# Patient Record
Sex: Male | Born: 1985 | Race: White | Hispanic: No | Marital: Single | Smoking: Former smoker
Health system: Southern US, Community
[De-identification: ages and names within clinical notes are randomized; demographics above are authoritative.]

## PROBLEM LIST (undated history)

## (undated) DIAGNOSIS — E7800 Pure hypercholesterolemia, unspecified: Secondary | ICD-10-CM

---

## 2015-07-28 ENCOUNTER — Emergency Department (HOSPITAL_COMMUNITY): Payer: 59

## 2015-07-28 ENCOUNTER — Encounter (HOSPITAL_COMMUNITY): Payer: Self-pay | Admitting: Emergency Medicine

## 2015-07-28 ENCOUNTER — Emergency Department (HOSPITAL_COMMUNITY)
Admission: EM | Admit: 2015-07-28 | Discharge: 2015-07-28 | Disposition: A | Discharge status: Home / Self Care | Payer: 59 | Attending: Emergency Medicine | Admitting: Emergency Medicine

## 2015-07-28 DIAGNOSIS — Z79899 Other long term (current) drug therapy: Secondary | ICD-10-CM | POA: Diagnosis not present

## 2015-07-28 DIAGNOSIS — F419 Anxiety disorder, unspecified: Secondary | ICD-10-CM | POA: Diagnosis not present

## 2015-07-28 DIAGNOSIS — R0602 Shortness of breath: Secondary | ICD-10-CM | POA: Insufficient documentation

## 2015-07-28 DIAGNOSIS — Z87891 Personal history of nicotine dependence: Secondary | ICD-10-CM | POA: Insufficient documentation

## 2015-07-28 DIAGNOSIS — E7800 Pure hypercholesterolemia, unspecified: Secondary | ICD-10-CM | POA: Diagnosis not present

## 2015-07-28 DIAGNOSIS — Z880 Allergy status to penicillin: Secondary | ICD-10-CM | POA: Diagnosis not present

## 2015-07-28 DIAGNOSIS — R51 Headache: Secondary | ICD-10-CM | POA: Insufficient documentation

## 2015-07-28 DIAGNOSIS — R079 Chest pain, unspecified: Secondary | ICD-10-CM | POA: Diagnosis not present

## 2015-07-28 HISTORY — DX: Pure hypercholesterolemia, unspecified: E78.00

## 2015-07-28 LAB — CBC
HEMATOCRIT: 43.1 % (ref 39.0–52.0)
Hemoglobin: 14.8 g/dL (ref 13.0–17.0)
MCH: 30.1 pg (ref 26.0–34.0)
MCHC: 34.3 g/dL (ref 30.0–36.0)
MCV: 87.8 fL (ref 78.0–100.0)
Platelets: 246 10*3/uL (ref 150–400)
RBC: 4.91 MIL/uL (ref 4.22–5.81)
RDW: 13.1 % (ref 11.5–15.5)
WBC: 10.3 10*3/uL (ref 4.0–10.5)

## 2015-07-28 LAB — BASIC METABOLIC PANEL
Anion gap: 8 (ref 5–15)
BUN: 12 mg/dL (ref 6–20)
CALCIUM: 9.2 mg/dL (ref 8.9–10.3)
CO2: 26 mmol/L (ref 22–32)
Chloride: 104 mmol/L (ref 101–111)
Creatinine, Ser: 0.96 mg/dL (ref 0.61–1.24)
GFR calc Af Amer: >60 mL/min (ref >60)
GLUCOSE: 82 mg/dL (ref 65–99)
Potassium: 3.8 mmol/L (ref 3.5–5.1)
Sodium: 138 mmol/L (ref 135–145)

## 2015-07-28 LAB — TROPONIN I

## 2015-07-28 MED ORDER — ASPIRIN 81 MG PO CHEW
324.0000 mg | CHEWABLE_TABLET | Freq: Once | ORAL | Status: AC
  Administered 2015-07-28: 324 mg via ORAL
  Filled 2015-07-28: qty 4

## 2015-07-28 MED ORDER — GI COCKTAIL ~~LOC~~
30.0000 mL | Freq: Once | ORAL | Status: AC
  Administered 2015-07-28: 30 mL via ORAL
  Filled 2015-07-28: qty 30

## 2015-07-28 NOTE — ED Notes (Signed)
EKG and chest x-ray already done

## 2015-07-28 NOTE — Discharge Instructions (Signed)

## 2015-07-28 NOTE — ED Notes (Signed)
Pt reports cp in mid chest, inc pain when breathing deeply.  Pain radiates into neck bilaterally.   Pt denies sob.  Pt alert and oriented.

## 2015-07-28 NOTE — ED Provider Notes (Signed)
CSN: [id number]     Arrival date & time 07/28/15  1334 History   First MD Initiated Contact with Patient 07/28/15 1432     Chief Complaint  Patient presents with  . Chest Pain     Patient is a 30 y.o. male presenting with chest pain. The history is provided by the patient.  Chest Pain Pain location:  Substernal area Pain quality: tightness   Pain radiates to:  Neck Pain radiates to the back: no   Pain severity:  Moderate Onset quality:  Gradual Duration:  4 hours Timing:  Constant Progression:  Improving Chronicity:  New Relieved by:  Nothing Worsened by:  Deep breathing and certain positions Associated symptoms: headache and shortness of breath   Associated symptoms: no diaphoresis, no syncope, not vomiting and no weakness   Risk factors: high cholesterol and smoking   Risk factors: no coronary artery disease, no diabetes mellitus, no hypertension, no immobilization and no prior DVT/PE    Pt reports while he was at  Work he had onset of chest tightness, radiates to neck Reports worsening with bending down and also deep breathing He has never had this before  Past Medical History  Diagnosis Date  . High cholesterol    History reviewed. No pertinent past surgical history. History reviewed. No pertinent family history. Social History  Substance Use Topics  . Smoking status: Former Games developer  . Smokeless tobacco: None  . Alcohol Use: Yes     Comment: couple times per week     Review of Systems  Constitutional: Negative for diaphoresis.  Eyes: Negative for visual disturbance.  Respiratory: Positive for shortness of breath.   Cardiovascular: Positive for chest pain. Negative for syncope.  Gastrointestinal: Negative for vomiting.  Neurological: Positive for headaches. Negative for syncope and weakness.       Mild HA yesterday  All other systems reviewed and are negative.     Allergies  Penicillins  Home Medications   Prior to Admission medications   Medication  Sig Start Date End Date Taking? Authorizing Provider  acetaminophen (TYLENOL) 500 MG tablet Take 1,000 mg by mouth every 6 (six) hours as needed for mild pain.   Yes Historical Provider, MD  caffeine 200 MG TABS tablet Take 200 mg by mouth daily.   Yes Historical Provider, MD  fenofibrate 160 MG tablet Take 1 tablet by mouth daily. 07/28/15  Yes Historical Provider, MD  LORazepam (ATIVAN) 0.5 MG tablet Take 1 tablet by mouth daily as needed for anxiety.  05/09/15  Yes Historical Provider, MD  rosuvastatin (CRESTOR) 40 MG tablet Take 40 mg by mouth daily. 07/04/15  Yes Historical Provider, MD  escitalopram (LEXAPRO) 10 MG tablet Take 10 mg by mouth daily. Reported on 07/28/2015 07/14/15   Historical Provider, MD   BP 136/87 mmHg  Pulse 81  Temp(Src) 97.4 F (36.3 C) (Oral)  Resp 16  Ht  (1.753 m)  Wt 100.245 kg  BMI 32.62 kg/m2  SpO2 100% Physical Exam CONSTITUTIONAL: Well developed/well nourished HEAD: Normocephalic/atraumatic EYES: EOMI/PERRL ENMT: Mucous membranes moist NECK: supple no meningeal signs SPINE/BACK:entire spine nontender CV: S1/S2 noted, no murmurs/rubs/gallops noted LUNGS: Lungs are clear to auscultation bilaterally, no apparent distress ABDOMEN: soft, nontender, no rebound or guarding, bowel sounds noted throughout abdomen GU:no cva tenderness NEURO: Pt is awake/alert/appropriate, moves all extremitiesx4.  No facial droop.   EXTREMITIES: pulses normal/equal, full ROM, no LE edema/tenderness SKIN: warm, color normal PSYCH: mildly anxious  ED Course  Procedures  3:01 PM  Pt appears PERC negative HEART score: 3 Pt already improving 3:52 PM Initial workup negative Pt reports pain is present but improving I discussed need to monitor in the ED and order repeat ekg/troponin at 1730 for rapid rule out He elects to go home instead I discussed limitations in only performing one ekg/troponin in the setting of possible ACS as I can not fully r/o ACS with one  ekg/troponin He understands this limitation and still want to go home We discussed strict return precautions I doubt PE/Aortic dissection at this time  Labs Review Labs Reviewed  BASIC METABOLIC PANEL  CBC  TROPONIN I    Imaging Review Dg Chest 2 View  07/28/2015  CLINICAL DATA:  Chest pain. EXAM: CHEST  2 VIEW COMPARISON:  None. FINDINGS: The heart size and mediastinal contours are within normal limits. Both lungs are clear. The visualized skeletal structures are unremarkable. IMPRESSION: Normal chest. Electronically Signed   By: Francene Boyers M.D.   On: 07/28/2015 14:13   I have personally reviewed and evaluated these images and lab results as part of my medical decision-making.   EKG Interpretation   Date/Time:  Monday July 28 2015 13:41:38 EST Ventricular Rate:  78 PR Interval:  148 QRS Duration: 102 QT Interval:  368 QTC Calculation: 419 R Axis:   29 Text Interpretation:  Normal sinus rhythm with sinus arrhythmia Normal ECG  No previous ECGs available Confirmed by Bebe Shaggy  MD, Dorinda Hill ([id number]) on  07/28/2015 2:33:13 PM     Medications  gi cocktail (Maalox,Lidocaine,Donnatal) (not administered)  aspirin chewable tablet 324 mg (324 mg Oral Given 07/28/15 1453)    MDM   Final diagnoses:  Chest pain, unspecified chest pain type    Nursing notes including past medical history and social history reviewed and considered in documentation xrays/imaging reviewed by myself and considered during evaluation Labs/vital reviewed myself and considered during evaluation     Zadie Rhine, MD 07/28/15 1555

## 2017-02-21 IMAGING — DX DG CHEST 2V
2 series · 2 of 2 positions shown · non-contrast
Comparison: None.

CLINICAL DATA: Chest pain.

EXAM:
CHEST  2 VIEW

[chest pa]
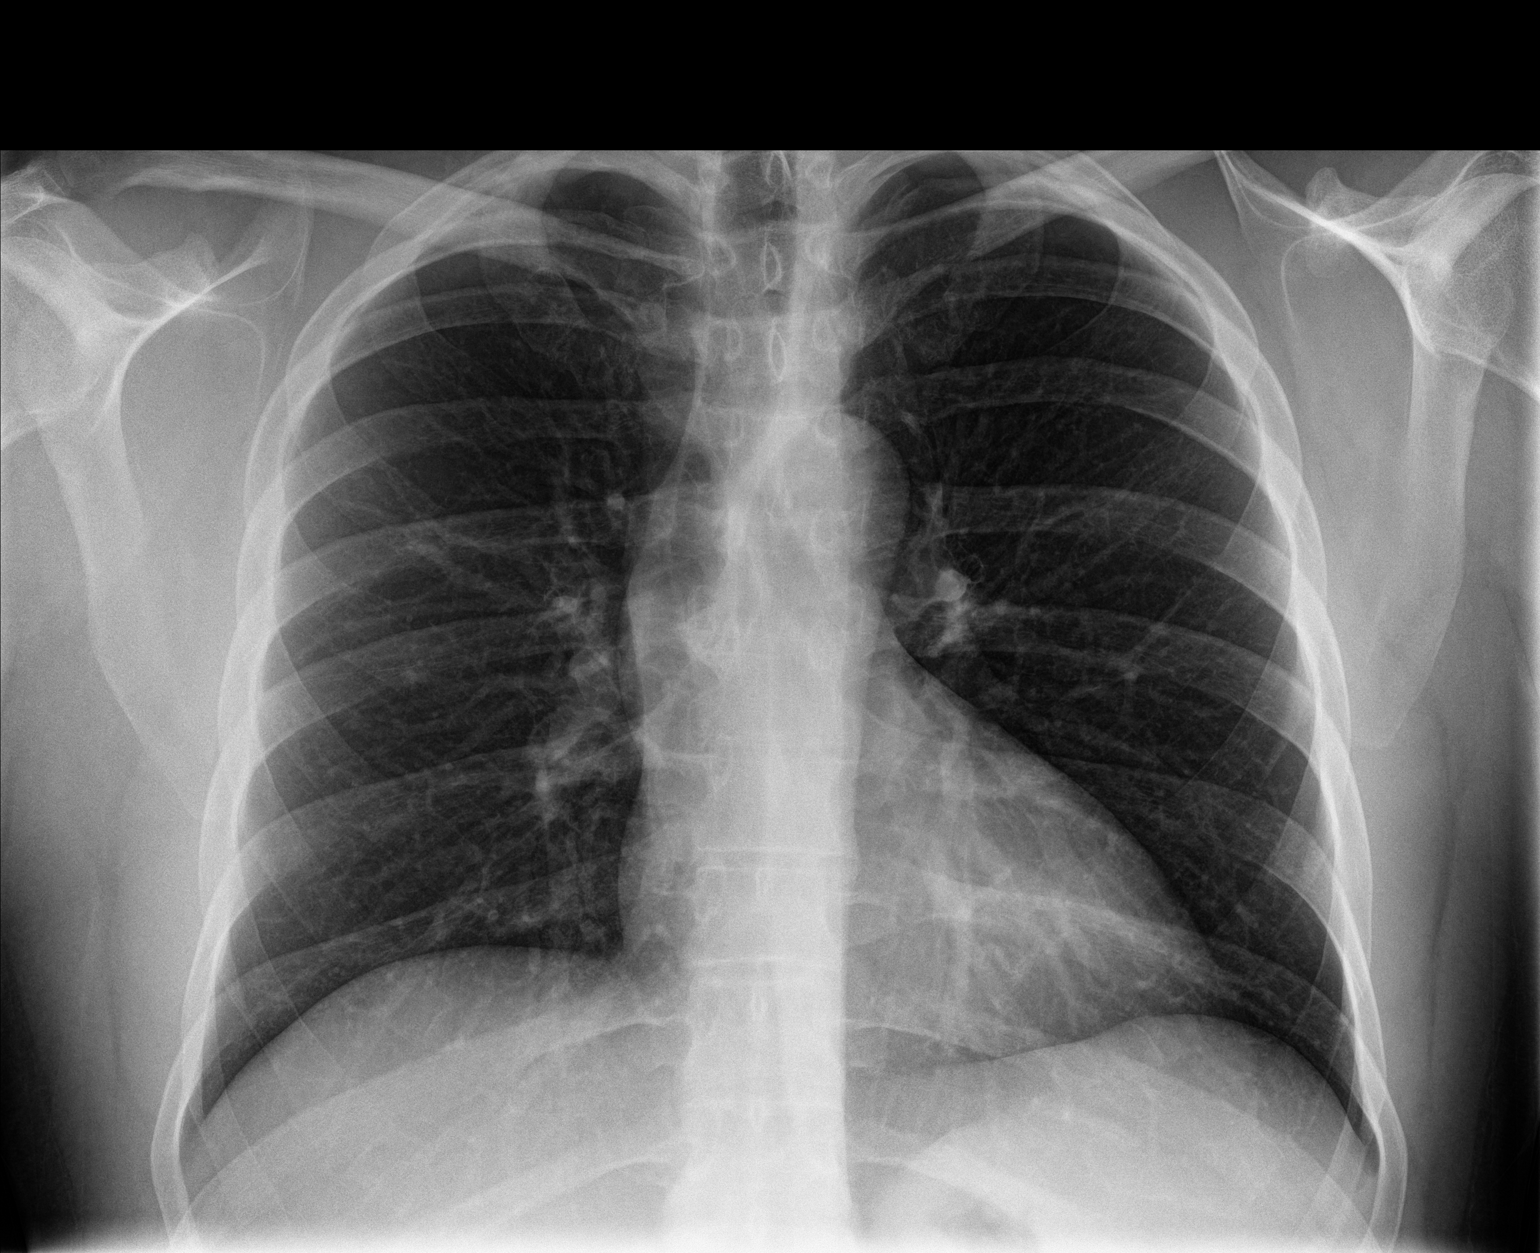

[chest lat]
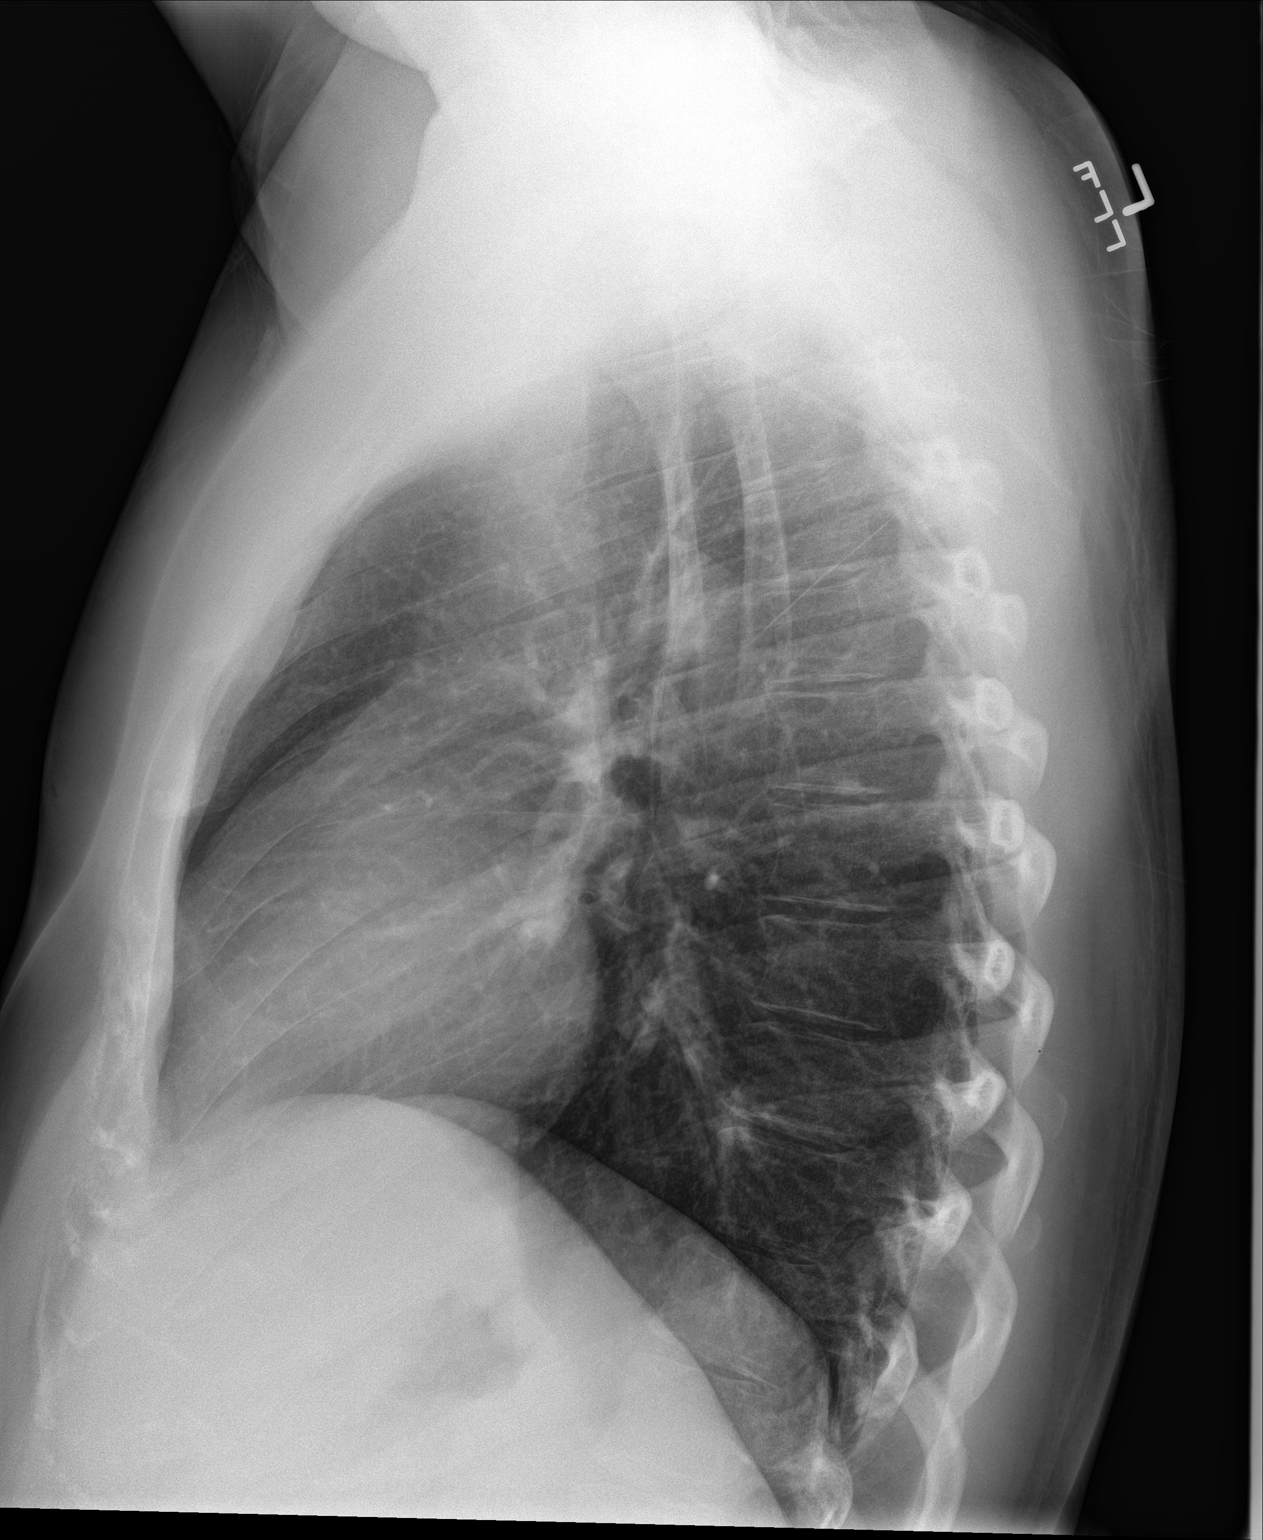

[2 of 2 positions shown; findings below may reference images not displayed]

FINDINGS: The heart size and mediastinal contours are within normal limits.
Both lungs are clear. The visualized skeletal structures are
unremarkable.
IMPRESSION: Normal chest.

## 2020-05-10 ENCOUNTER — Other Ambulatory Visit: Payer: Self-pay

## 2020-05-10 ENCOUNTER — Emergency Department
Admission: EM | Admit: 2020-05-10 | Discharge: 2020-05-10 | Disposition: A | Discharge status: Home / Self Care | Payer: 59 | Source: Home / Self Care

## 2020-05-10 DIAGNOSIS — J029 Acute pharyngitis, unspecified: Secondary | ICD-10-CM | POA: Diagnosis not present

## 2020-05-10 LAB — POCT RAPID STREP A (OFFICE): Rapid Strep A Screen: NEGATIVE

## 2020-05-10 NOTE — Discharge Instructions (Signed)
  You may take 500mg acetaminophen every 4-6 hours or in combination with ibuprofen 400-600mg every 6-8 hours as needed for pain, inflammation, and fever.  Be sure to well hydrated with clear liquids and get at least 8 hours of sleep at night, preferably more while sick.   Please follow up with family medicine in 1 week if needed.   

## 2020-05-10 NOTE — ED Provider Notes (Signed)
[facility]    CSN: [id number] Arrival date & time: 05/10/20  0810      History   Chief Complaint Chief Complaint  Patient presents with  . Sore Throat    HPI Adam Bird is a 34 y.o. male.   HPI  Adam Bird is a 34 y.o. male presenting to UC with c/o sore throat that started about 1 week ago. Pain is 6/10, worse with swallowing. Mild relief with OTC throat lozenges, tylenol and motrin. Denies difficulty breathing or swallowing. Pt states his wife tested positive for strep throat recently but then stated her throat pain improved after just 1 dose of antibiotics so she stopped taking the antibiotic. Pt stated it was "a nondescript strep."  Pt denies fever, chills, n/v/d.    Past Medical History:  Diagnosis Date  . High cholesterol     There are no problems to display for this patient.   History reviewed. No pertinent surgical history.     Home Medications    Prior to Admission medications   Medication Sig Start Date End Date Taking? Authorizing Provider  EZETIMIBE PO Take by mouth.   Yes [provider]  acetaminophen (TYLENOL) 500 MG tablet Take 1,000 mg by mouth every 6 (six) hours as needed for mild pain.    [provider]  caffeine 200 MG TABS tablet Take 200 mg by mouth daily.    [provider]  escitalopram (LEXAPRO) 10 MG tablet Take 10 mg by mouth daily. Reported on 07/28/2015 07/14/15   [provider]  fenofibrate 160 MG tablet Take 1 tablet by mouth daily. 07/28/15   [provider]  LORazepam (ATIVAN) 0.5 MG tablet Take 1 tablet by mouth daily as needed for anxiety.  05/09/15   [provider]  rosuvastatin (CRESTOR) 40 MG tablet Take 40 mg by mouth daily. 07/04/15   [provider]    Family History Family History  Problem Relation Age of Onset  . Healthy Mother   . Healthy Father     Social History Social History   Tobacco Use  . Smoking status: Former Games developer  .  Smokeless tobacco: Never Used  Substance Use Topics  . Alcohol use: Yes    Comment: couple times per week   . Drug use: No     Allergies   Penicillins   Review of Systems Review of Systems  Constitutional: Negative for chills and fever.  HENT: Positive for sore throat. Negative for congestion, ear pain, trouble swallowing and voice change.   Respiratory: Negative for cough and shortness of breath.   Cardiovascular: Negative for chest pain and palpitations.  Gastrointestinal: Negative for abdominal pain, diarrhea, nausea and vomiting.  Musculoskeletal: Negative for arthralgias, back pain and myalgias.  Skin: Negative for rash.  Neurological: Negative for dizziness, light-headedness and headaches.  All other systems reviewed and are negative.    Physical Exam Triage Vital Signs ED Triage Vitals  Enc Vitals Group     BP 05/10/20 0822 134/86     Pulse Rate 05/10/20 0822 75     Resp 05/10/20 0822 16     Temp 05/10/20 0822 98.8 F (37.1 C)     Temp Source 05/10/20 0822 Oral     SpO2 05/10/20 0822 98 %     Weight --      Height --      Head Circumference --      Peak Flow --      Pain Score 05/10/20 0819  6     Pain Loc --      Pain Edu? --      Excl. in GC? --    No data found.  Updated Vital Signs BP 134/86 (BP Location: Right Arm)   Pulse 75   Temp 98.8 F (37.1 C) (Oral)   Resp 16   SpO2 98%   Visual Acuity Right Eye Distance:   Left Eye Distance:   Bilateral Distance:    Right Eye Near:   Left Eye Near:    Bilateral Near:     Physical Exam Vitals and nursing note reviewed.  Constitutional:      General: He is not in acute distress.    Appearance: He is well-developed. He is not ill-appearing, toxic-appearing or diaphoretic.  HENT:     Head: Normocephalic and atraumatic.     Right Ear: Tympanic membrane and ear canal normal.     Left Ear: Tympanic membrane and ear canal normal.     Mouth/Throat:     Mouth: Mucous membranes are moist. No oral  lesions.     Pharynx: Oropharynx is clear. Uvula midline. Posterior oropharyngeal erythema present. No pharyngeal swelling, oropharyngeal exudate or uvula swelling.  Cardiovascular:     Rate and Rhythm: Normal rate and regular rhythm.  Pulmonary:     Effort: Pulmonary effort is normal. No respiratory distress.     Breath sounds: Normal breath sounds. No stridor. No wheezing, rhonchi or rales.  Musculoskeletal:        General: Normal range of motion.     Cervical back: Normal range of motion and neck supple.  Lymphadenopathy:     Cervical: No cervical adenopathy.  Skin:    General: Skin is warm and dry.  Neurological:     Mental Status: He is alert and oriented to person, place, and time.  Psychiatric:        Behavior: Behavior normal.      UC Treatments / Results  Labs (all labs ordered are listed, but only abnormal results are displayed) Labs Reviewed  STREP A DNA PROBE  POCT RAPID STREP A (OFFICE)    EKG   Radiology No results found.  Procedures Procedures (including critical care time)  Medications Ordered in UC Medications - No data to display  Initial Impression / Assessment and Plan / UC Course  I have reviewed the triage vital signs and the nursing notes.  Pertinent labs & imaging results that were available during my care of the patient were reviewed by me and considered in my medical decision making (see chart for details).     Rapid strep: NEGATIVE Culture sent Encouraged f/u with PCP next week as needed Also discussed proper use of antibiotics  Final Clinical Impressions(s) / UC Diagnoses   Final diagnoses:  Acute pharyngitis, unspecified etiology     Discharge Instructions      You may take 500mg  acetaminophen every 4-6 hours or in combination with ibuprofen 400-600mg  every 6-8 hours as needed for pain, inflammation, and fever.  Be sure to well hydrated with clear liquids and get at least 8 hours of sleep at night, preferably more while  sick.   Please follow up with family medicine in 1 week if needed.     ED Prescriptions    None     PDMP not reviewed this encounter.   , Lurene Shadow 05/10/20 [phone]

## 2020-05-10 NOTE — ED Triage Notes (Signed)
Patient presents to Urgent Care with complaints of sore throat since about a week ago. Patient reports his wife just tested positive for strep, thinks he may have it too. Denies fevers, has had tonsils removed. Pt has been vaccinated for covid.

## 2020-05-12 LAB — STREP A DNA PROBE: Group A Strep Probe: NOT DETECTED
